# Patient Record
Sex: Male | Born: 1990 | Race: White | Hispanic: No | Marital: Married | Smoking: Current every day smoker
Health system: Southern US, Community
[De-identification: ages and names within clinical notes are randomized; demographics above are authoritative.]

---
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
## 2019-02-19 ENCOUNTER — Other Ambulatory Visit: Payer: Self-pay

## 2019-02-19 ENCOUNTER — Encounter (HOSPITAL_COMMUNITY): Payer: Self-pay | Admitting: Emergency Medicine

## 2019-02-19 ENCOUNTER — Emergency Department (HOSPITAL_COMMUNITY)
Admission: EM | Admit: 2019-02-19 | Discharge: 2019-02-19 | Disposition: A | Discharge status: Home / Self Care | Payer: Self-pay | Attending: Emergency Medicine | Admitting: Emergency Medicine

## 2019-02-19 ENCOUNTER — Emergency Department (HOSPITAL_COMMUNITY): Payer: Self-pay

## 2019-02-19 DIAGNOSIS — R0789 Other chest pain: Secondary | ICD-10-CM | POA: Insufficient documentation

## 2019-02-19 DIAGNOSIS — F17200 Nicotine dependence, unspecified, uncomplicated: Secondary | ICD-10-CM | POA: Insufficient documentation

## 2019-02-19 LAB — BASIC METABOLIC PANEL
Anion gap: 11 (ref 5–15)
BUN: 11 mg/dL (ref 6–20)
CO2: 24 mmol/L (ref 22–32)
Calcium: 9.7 mg/dL (ref 8.9–10.3)
Chloride: 103 mmol/L (ref 98–111)
Creatinine, Ser: 0.97 mg/dL (ref 0.61–1.24)
GFR calc Af Amer: >60 mL/min (ref >60)
GFR calc non Af Amer: >60 mL/min (ref >60)
Glucose, Bld: 79 mg/dL (ref 70–99)
Potassium: 3.8 mmol/L (ref 3.5–5.1)
Sodium: 138 mmol/L (ref 135–145)

## 2019-02-19 LAB — CBG MONITORING, ED: Glucose-Capillary: 90 mg/dL (ref 70–99)

## 2019-02-19 LAB — CBC
HCT: 45.2 % (ref 39.0–52.0)
Hemoglobin: 15.1 g/dL (ref 13.0–17.0)
MCH: 31.3 pg (ref 26.0–34.0)
MCHC: 33.4 g/dL (ref 30.0–36.0)
MCV: 93.6 fL (ref 80.0–100.0)
Platelets: 245 10*3/uL (ref 150–400)
RBC: 4.83 MIL/uL (ref 4.22–5.81)
RDW: 12.8 % (ref 11.5–15.5)
WBC: 8.6 10*3/uL (ref 4.0–10.5)
nRBC: 0 % (ref 0.0–0.2)

## 2019-02-19 LAB — TROPONIN I (HIGH SENSITIVITY)
Troponin I (High Sensitivity): 4 ng/L (ref <18)
Troponin I (High Sensitivity): 7 ng/L (ref <18)

## 2019-02-19 MED ORDER — SODIUM CHLORIDE 0.9% FLUSH: 3.0000 mL | Freq: Once | INTRAVENOUS | Status: DC

## 2019-02-19 NOTE — ED Triage Notes (Addendum)
Pt arrives to ED from work with complaints of left sided chest pain and shortness of breath that lasted 30-45 minutes that has now resolved. Patient stated that this has been going on and off for the past year. Patient states that he had a sausage biscuit for breakfast this morning.

## 2019-02-19 NOTE — Discharge Instructions (Addendum)
It is important to follow-up with a primary care doctor for further evaluation of your atypical chest pain. Return to emergency room if you develop psesistent chest pain, difficulty breathing, or any new, worsening, or concerning symptoms.

## 2019-02-19 NOTE — ED Provider Notes (Signed)
[facility] EMERGENCY DEPARTMENT Provider Note   CSN: [id number] Arrival date & time: 02/19/19  1408     History   Chief Complaint Chief Complaint  Patient presents with  . Chest Pain    HPI Cameron Beard is a 28 y.o. male presenting for evaluation of chest pain.  Patient states around 11:00 this morning he had acute onset left chest pain.  He describes it as a pressure that radiated to his axilla which lasted 1 hour.  During that time he had 30 seconds of vision darkening before this resolved without intervention.  Patient reports pain resolved without intervention, he has been pain-free since.  He has had 5 similar episodes throughout this year, no similar episodes before that.  These episodes sometimes occur while he is at work which is very physical, but have also occurred at rest.  No relation to time of the day or food intake.  He has no other medical problems, takes no medications daily.  He smokes cigarettes, about 1 pack/day.  He denies family history of early cardiac death.  No history of diabetes or hypertension.  He denies associated diaphoresis, dizziness, nausea, vomiting, shortness of breath.  He is here today because these episodes keep happening, and he wanted to get it checked out. He is from [place], will be leaving [place] later today.      HPI  History reviewed. No pertinent past medical history.  There are no active problems to display for this patient.   History reviewed. No pertinent surgical history.      Home Medications    Prior to Admission medications   Not on File    Family History History reviewed. No pertinent family history.  Social History Social History   Tobacco Use  . Smoking status: Current Every Day Smoker  . Smokeless tobacco: Never Used  Substance Use Topics  . Alcohol use: Yes  . Drug use: Yes    Types: Marijuana     Allergies   Patient has no known allergies.   Review of Systems Review of  Systems  Eyes: Positive for visual disturbance (Resolved).  Cardiovascular: Positive for chest pain (Resolved).  All other systems reviewed and are negative.    Physical Exam Updated Vital Signs BP 112/68 (BP Location: Left Arm)   Pulse 79   Temp 98.9 F (37.2 C) (Oral)   Resp 18   Wt 63.5 kg   SpO2 98%   Physical Exam Vitals signs and nursing note reviewed.  Constitutional:      General: He is not in acute distress.    Appearance: He is well-developed.     Comments: Sitting comfortably in the bed in no acute distress  HENT:     Head: Normocephalic and atraumatic.  Eyes:     Conjunctiva/sclera: Conjunctivae normal.     Pupils: Pupils are equal, round, and reactive to light.  Neck:     Musculoskeletal: Normal range of motion and neck supple.  Cardiovascular:     Rate and Rhythm: Normal rate and regular rhythm.     Pulses: Normal pulses.  Pulmonary:     Effort: Pulmonary effort is normal. No respiratory distress.     Breath sounds: Normal breath sounds. No wheezing.     Comments: Speaking in full sentences.  Clear lung sounds in all fields. Chest:     Chest wall: No tenderness.  Abdominal:     General: There is no distension.     Palpations: Abdomen is  soft.     Tenderness: There is no abdominal tenderness. There is no guarding or rebound.  Musculoskeletal: Normal range of motion.     Right lower leg: No edema.     Left lower leg: No edema.     Comments: No leg pain or swelling  Skin:    General: Skin is warm and dry.  Neurological:     Mental Status: He is alert and oriented to person, place, and time.      ED Treatments / Results  Labs (all labs ordered are listed, but only abnormal results are displayed) Labs Reviewed  BASIC METABOLIC PANEL  CBC  CBG MONITORING, ED  TROPONIN I (HIGH SENSITIVITY)  TROPONIN I (HIGH SENSITIVITY)    EKG EKG Interpretation  Date/Time:  Saturday February 19 2019 14:12:28 EST Ventricular Rate:  86 PR Interval:  142  QRS Duration: 94 QT Interval:  360 QTC Calculation: 430 R Axis:   104 Text Interpretation: Normal sinus rhythm with sinus arrhythmia Rightward axis Borderline ECG No old tracing to compare Confirmed by Pricilla Loveless [phone]) on 02/19/2019 3:32:00 PM   Radiology Dg Chest 2 View  Result Date: 02/19/2019 CLINICAL DATA:  Chest pain EXAM: CHEST - 2 VIEW COMPARISON:  None. FINDINGS: The heart size and mediastinal contours are within normal limits. Both lungs are clear. The visualized skeletal structures are unremarkable. IMPRESSION: No acute abnormality of the lungs. Electronically Signed   By: Lauralyn Primes M.D.   On: 02/19/2019 15:02    Procedures Procedures (including critical care time)  Medications Ordered in ED Medications - No data to display   Initial Impression / Assessment and Plan / ED Course  I have reviewed the triage vital signs and the nursing notes.  Pertinent labs & imaging results that were available during my care of the patient were reviewed by me and considered in my medical decision making (see chart for details).    Patient presenting for evaluation of chest pain.  Physical exam reassuring, he appears nontoxic.  Chest pain has resolved.  Patient without many cardiac risk factors, only smoking.  However considering repeat recurrences of this chest pain, will obtain cardiac labs to rule out ACS.  Doubt pneumothorax, PE, or dissection, as it is exam and history are not consistent with this.  Initial labs reassuring, no leukocytosis.  Kidney function normal.  Electrolytes stable.  Initial troponin negative.  X-ray viewed interpreted by me, no pneumonia pneumothorax and effusion, cardiomegaly.  EKG without STEMI.  Will obtain repeat troponin as event happened today.  Repeat troponin negative.  As such, I have very low suspicion for ACS.  Discussed follow-up with primary care for further evaluation of symptoms.  Patient to follow-up in [place], as such no resources were  given for [place].  At this time, patient appears safe for discharge.  Return precautions given.  Patient states he understands and agrees to plan.  Final Clinical Impressions(s) / ED Diagnoses   Final diagnoses:  Atypical chest pain    ED Discharge Orders    None       Alveria Apley, PA-C 02/20/19 0112    Pricilla Loveless, MD 02/20/19 [phone]

## 2021-04-25 IMAGING — DX DG CHEST 2V
2 series · 2 of 2 positions shown · non-contrast
Comparison: None.

CLINICAL DATA: Chest pain

EXAM:
CHEST - 2 VIEW

[chest pa]
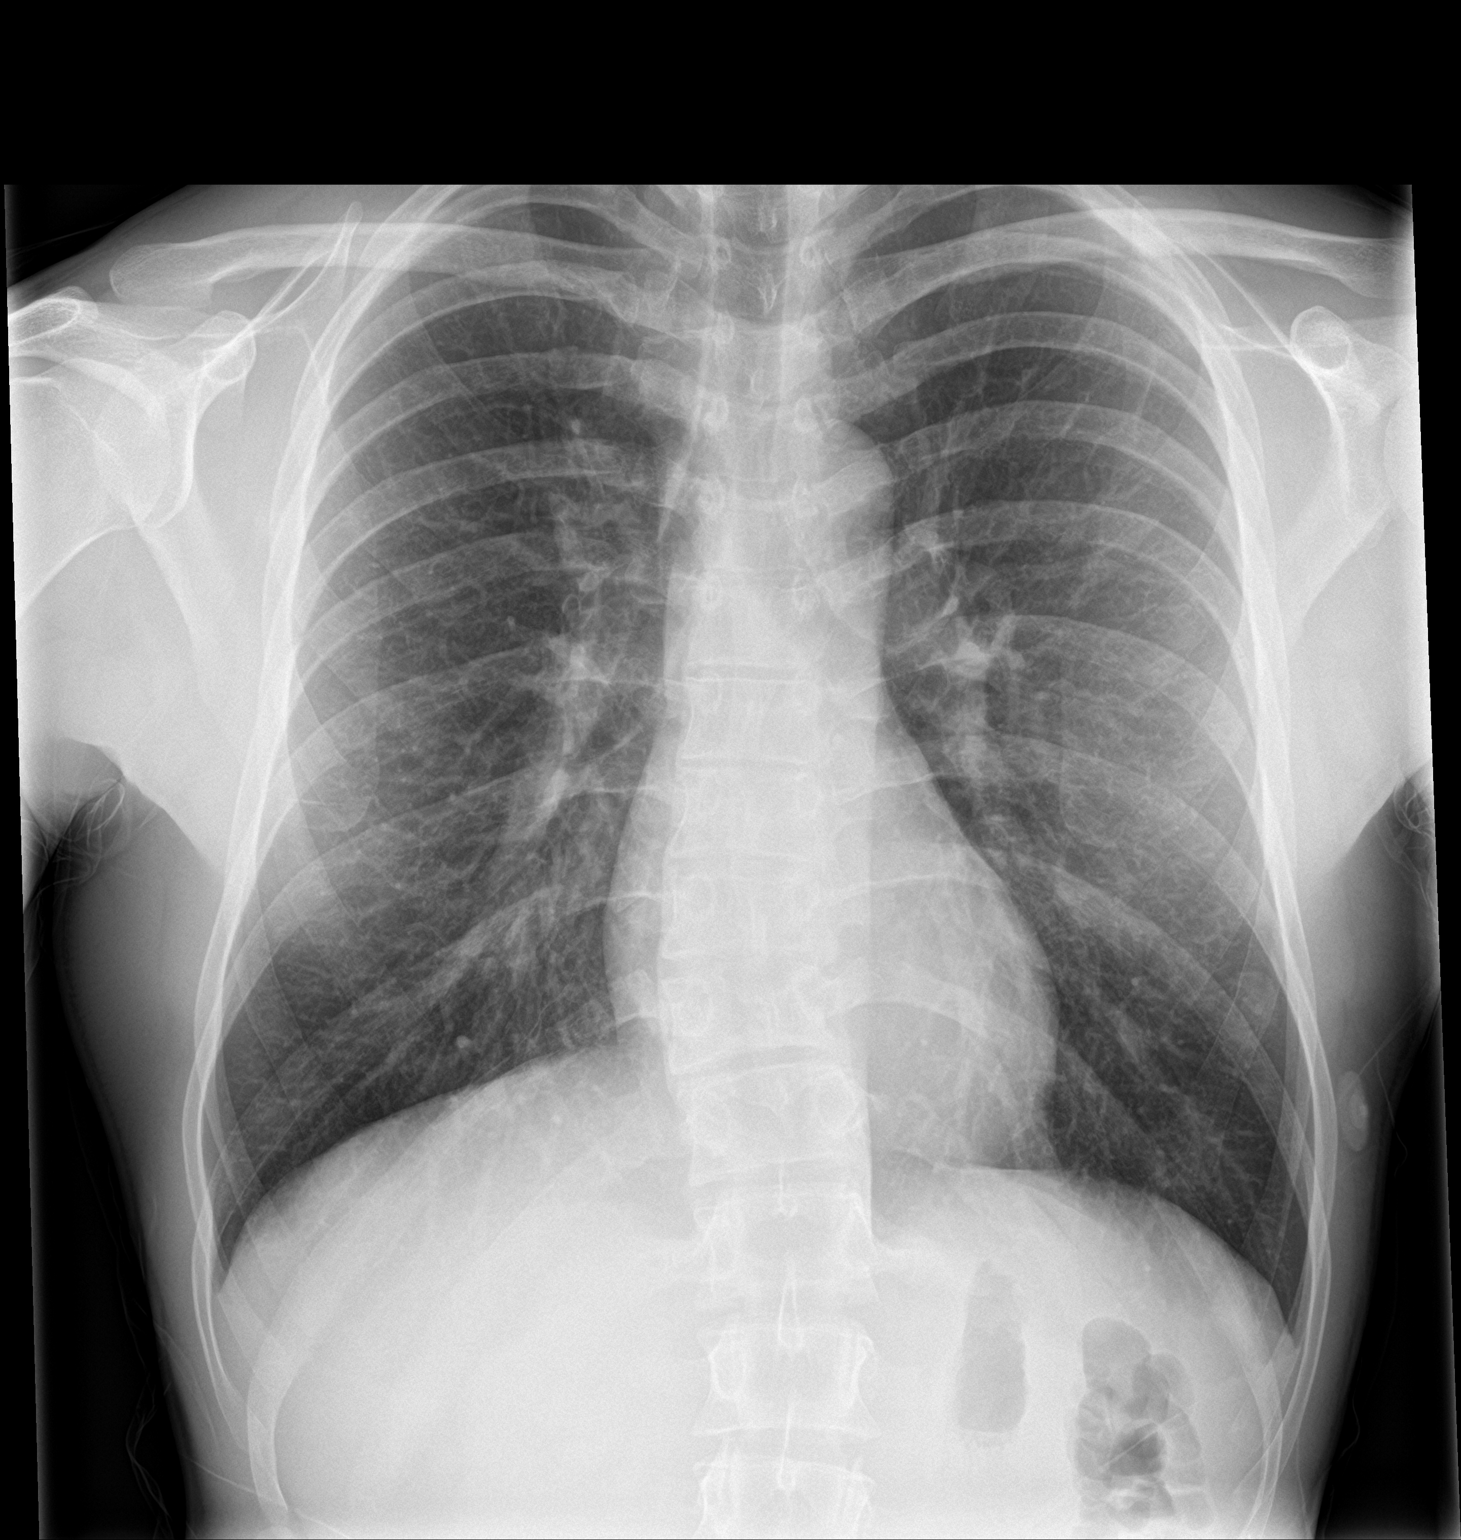

[chest lat]
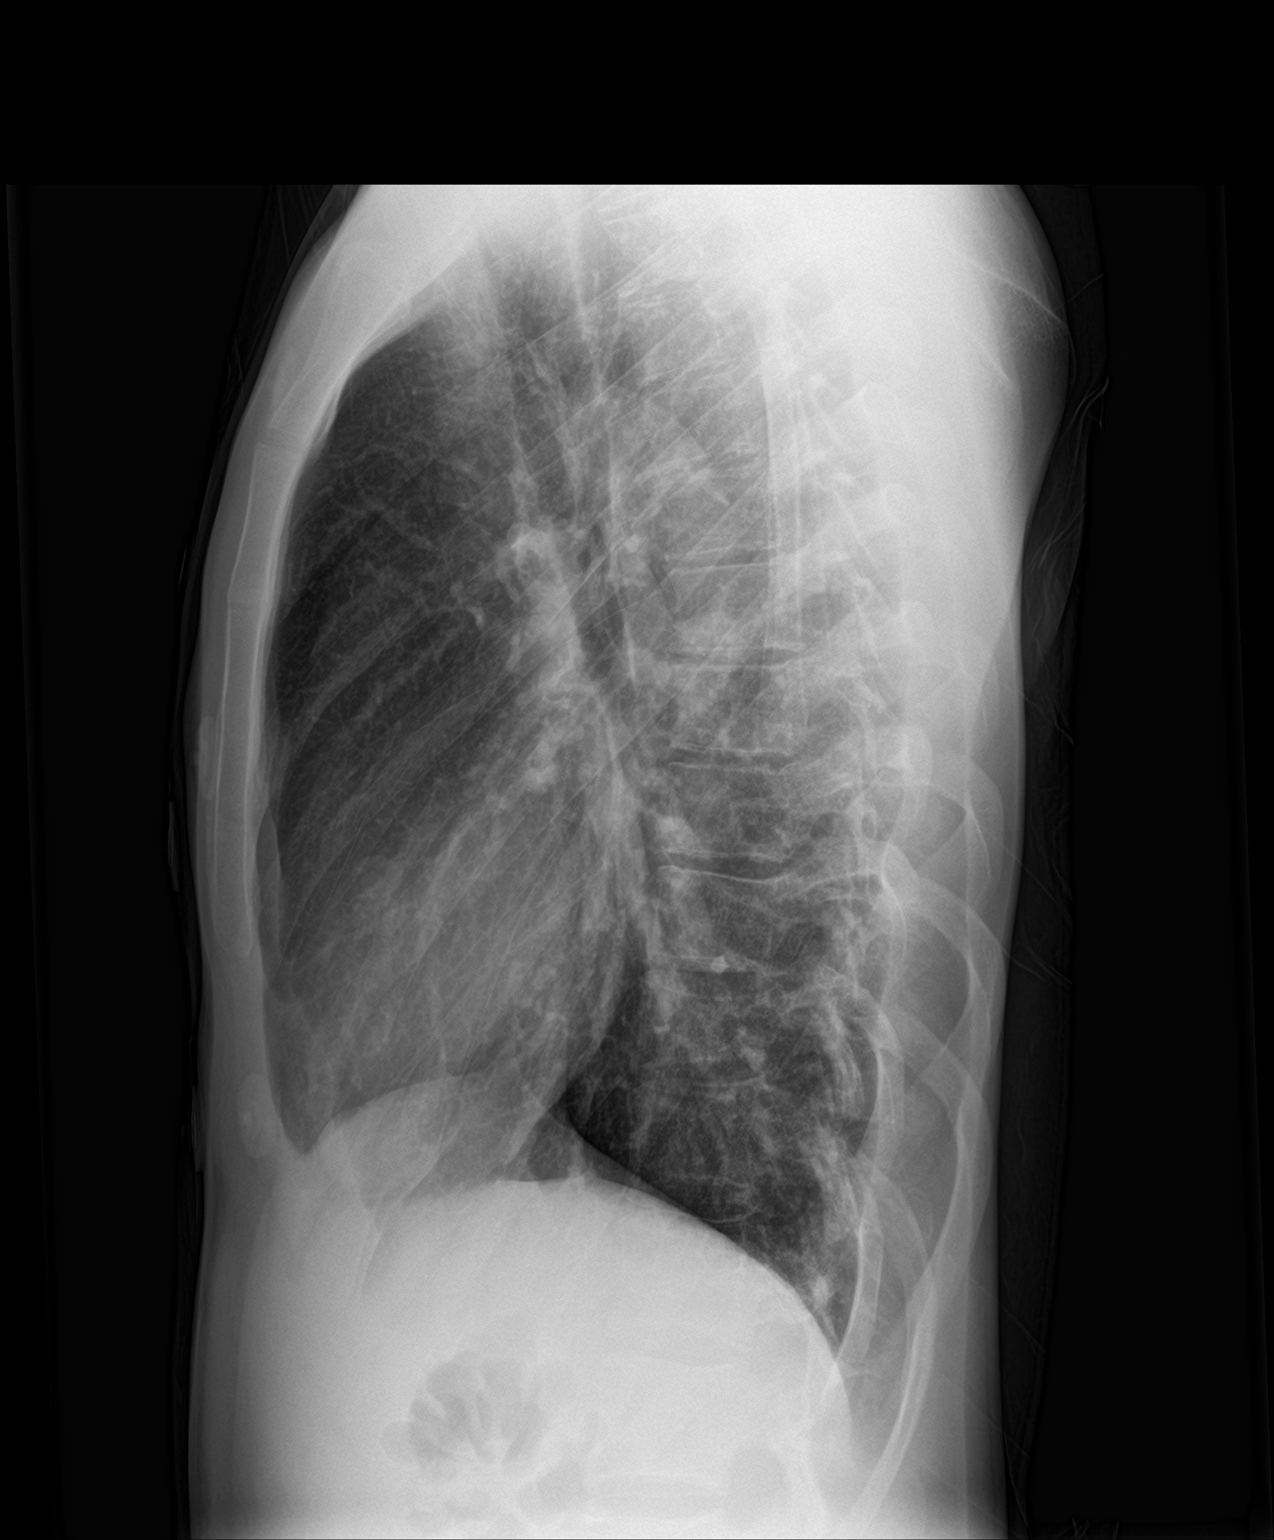

[2 of 2 positions shown; findings below may reference images not displayed]

FINDINGS: The heart size and mediastinal contours are within normal limits.
Both lungs are clear. The visualized skeletal structures are
unremarkable.
IMPRESSION: No acute abnormality of the lungs.
